# Patient Record
Sex: Male | Born: 1980 | Race: White | Hispanic: No | Marital: Single | Smoking: Former smoker
Health system: Southern US, Community
[De-identification: ages and names within clinical notes are randomized; demographics above are authoritative.]

## PROBLEM LIST (undated history)

## (undated) DIAGNOSIS — I10 Essential (primary) hypertension: Secondary | ICD-10-CM

## (undated) DIAGNOSIS — G4730 Sleep apnea, unspecified: Secondary | ICD-10-CM

## (undated) DIAGNOSIS — E669 Obesity, unspecified: Secondary | ICD-10-CM

## (undated) HISTORY — DX: Essential (primary) hypertension: I10

---

## 2003-10-02 HISTORY — PX: MOUTH SURGERY: SHX715

## 2017-09-04 ENCOUNTER — Ambulatory Visit
Admission: EM | Admit: 2017-09-04 | Discharge: 2017-09-04 | Disposition: A | Discharge status: Home / Self Care | Payer: Self-pay | Attending: Family Medicine | Admitting: Family Medicine

## 2017-09-04 ENCOUNTER — Other Ambulatory Visit: Payer: Self-pay

## 2017-09-04 ENCOUNTER — Encounter: Payer: Self-pay | Admitting: Emergency Medicine

## 2017-09-04 DIAGNOSIS — R3915 Urgency of urination: Secondary | ICD-10-CM

## 2017-09-04 HISTORY — DX: Obesity, unspecified: E66.9

## 2017-09-04 LAB — URINALYSIS, COMPLETE (UACMP) WITH MICROSCOPIC
Bacteria, UA: NONE SEEN
Bilirubin Urine: NEGATIVE
GLUCOSE, UA: NEGATIVE mg/dL
HGB URINE DIPSTICK: NEGATIVE
Ketones, ur: NEGATIVE mg/dL
Leukocytes, UA: NEGATIVE
NITRITE: NEGATIVE
PH: 6 (ref 5.0–8.0)
Protein, ur: NEGATIVE mg/dL
Specific Gravity, Urine: 1.025 (ref 1.005–1.030)

## 2017-09-04 LAB — CHLAMYDIA/NGC RT PCR (ARMC ONLY)
Chlamydia Tr: NOT DETECTED
N GONORRHOEAE: NOT DETECTED

## 2017-09-04 NOTE — ED Triage Notes (Signed)
Patient in today c/o 3 day history of urinary urgency and slight dysuria. Patient denies fever. Patient has not tried any OTC meds.

## 2017-09-04 NOTE — Discharge Instructions (Signed)
Over the counter pyridium Dwain Sarna(Urispas)

## 2017-09-30 DIAGNOSIS — R030 Elevated blood-pressure reading, without diagnosis of hypertension: Secondary | ICD-10-CM | POA: Insufficient documentation

## 2017-09-30 DIAGNOSIS — Z0000 Encounter for general adult medical examination without abnormal findings: Secondary | ICD-10-CM | POA: Insufficient documentation

## 2017-12-23 NOTE — Progress Notes (Signed)
12/24/2017 11:56 AM   Michael Cuevas [birth year] [id number]  Referring provider: Gracelyn NurseJohnston, John D, MD [address] [place], [place] [id number]  Chief Complaint  Patient presents with  . Establish Care    urinary urgency, possible phimosis     HPI: Patient is a [age] year old Caucasian male referred by Dr. Letitia LibraJohnston for urinary urgency and possible phimosis.  He states that he is having intermittent symptoms of frequency x5 and a strong urge to urinate.  He stated that this began at the end of November.  He was evaluated at an urgent care center who stated they had no idea while he was experiencing the symptoms and to acquire himself a PCP.  He then went to another urgent care and was diagnosed with prostatitis and placed on antibiotics which provided some relief.  A month ago his symptoms returned and at that time he had acquired a PCP.  He states his PCP gave him the same antibiotic which helped lessen his symptoms but did not completely abate.  He states that sitting down and at nighttime this urgency is worse.  He is not noted anything that helps the urgency.  Patient denies any gross hematuria, dysuria or suprapubic/flank pain.  Patient denies any fevers, chills, nausea or vomiting.  His UA is negative.   His PVR is 11 mL.    He states he is on sure how long he has had difficulty with his foreskin.    He consumes 2-3 alcoholic beverages per week.  He has 1 caffeinated beverage daily.  He is a former smoker.  He quit 15 years ago, but he smoked 1 pack a day for 10 years.   PMH: Past Medical History:  Diagnosis Date  . Hypertension   . Obesity     Surgical History: No past surgical history on file.  Home Medications:  Allergies as of 12/24/2017   No Known Allergies     Medication List        Accurate as of 12/24/17 11:59 PM. Always use your most recent med list.          lisinopril 10 MG tablet Commonly known as:  PRINIVIL,ZESTRIL Take by mouth.        Allergies: No Known Allergies  Family History: Family History  Problem Relation Age of Onset  . Lupus Mother   . Cancer Father [id number]       bladder    Social History:  reports that he quit smoking about 12 years ago. He has never used smokeless tobacco. He reports that he drinks alcohol. He reports that he does not use drugs.  ROS: UROLOGY Frequent Urination?: Yes Hard to postpone urination?: Yes Burning/pain with urination?: No Get up at night to urinate?: No Leakage of urine?: No Urine stream starts and stops?: No Trouble starting stream?: No Do you have to strain to urinate?: No Blood in urine?: No Urinary tract infection?: No Sexually transmitted disease?: No Injury to kidneys or bladder?: No Painful intercourse?: No Weak stream?: No Erection problems?: No Penile pain?: No  Gastrointestinal Nausea?: No Vomiting?: No Indigestion/heartburn?: No Diarrhea?: No Constipation?: No  Constitutional Fever: No Night sweats?: No Weight loss?: No Fatigue?: No  Skin Skin rash/lesions?: No Itching?: No  Eyes Blurred vision?: No Double vision?: No  Ears/Nose/Throat Sore throat?: No Sinus problems?: No  Hematologic/Lymphatic Swollen glands?: No Easy bruising?: No  Cardiovascular Leg swelling?: No Chest pain?: No  Respiratory Cough?: No Shortness of breath?: No  Endocrine Excessive thirst?: No  Musculoskeletal Back pain?: No Joint pain?: No  Neurological Headaches?: No Dizziness?: No  Psychologic Depression?: No Anxiety?: No  Physical Exam: BP (!) 141/92   Pulse 99   Wt (!) 358 lb 1.6 oz (162.4 kg)   BMI 54.45 kg/m   Constitutional: Well nourished. Alert and oriented, No acute distress. HEENT: [place] AT, moist mucus membranes. Trachea midline, no masses. Cardiovascular: No clubbing, cyanosis, or edema. Respiratory: Normal respiratory effort, no increased work of breathing. GI: Abdomen is soft, non tender, non distended, no abdominal  masses. Liver and spleen not palpable.  No hernias appreciated.  Stool sample for occult testing is not indicated.   GU: No CVA tenderness.  No bladder fullness or masses.  Patient with buried penis.  Phimosis with a 3 mm aperature.  Urethral meatus is patent.  No penile discharge. No penile lesions or rashes. Scrotum without lesions, cysts, rashes and/or edema.  Testicles are located scrotally bilaterally. No masses are appreciated in the testicles. Left and right epididymis are normal. Rectal: Patient with  normal sphincter tone. Anus and perineum without scarring or rashes. No rectal masses are appreciated. Prostate is approximately 35 grams, no nodules are appreciated. Seminal vesicles are normal. Skin: No rashes, bruises or suspicious lesions. Lymph: No cervical or inguinal adenopathy. Neurologic: Grossly intact, no focal deficits, moving all 4 extremities. Psychiatric: Normal mood and affect.  Laboratory Data: PSA  1.10 in 11/2017 No results found for: WBC, HGB, HCT, MCV, PLT  No results found for: CREATININE  No results found for: PSA  No results found for: TESTOSTERONE  No results found for: HGBA1C  No results found for: TSH  No results found for: CHOL, HDL, CHOLHDL, VLDL, LDLCALC  No results found for: AST No results found for: ALT No components found for: ALKALINEPHOPHATASE No components found for: BILIRUBINTOTAL  No results found for: ESTRADIOL  Urinalysis Negative.  See Epic.  I have reviewed the labs.   Pertinent Imaging: Results for REYNOLDS, KITTEL (MRN [id number]) as of 01/13/2018 11:53  Ref. Range 12/24/2017 13:29  Scan Result Unknown 11 ml    Assessment & Plan:    1. Urgency Patient's UA today is negative and antibiotics in the past have provided some relief We will send urine for culture today PVR is minimal, so he has no difficulty emptying his bladder We will need to obtain CT renal stone study to evaluate for a possible ureteral stone causing the  symptoms Advised to contact our office or seek treatment in the ED if becomes febrile or pain/ vomiting are difficult control in order to arrange for emergent/urgent intervention  2. Phimosis Patient will need to undergo circumcision in the OR due to his body habitus Patient will contact his insurance company and the hospital to see if this is a viable option for him  Return for pending CT results.  These notes generated with voice recognition software. I apologize for typographical errors.  Michiel Cowboy, PA-C  [facility] [address] [place], [place] [address] [phone]

## 2017-12-24 ENCOUNTER — Other Ambulatory Visit: Payer: Self-pay

## 2017-12-24 ENCOUNTER — Ambulatory Visit (INDEPENDENT_AMBULATORY_CARE_PROVIDER_SITE_OTHER): Payer: Self-pay | Admitting: Urology

## 2017-12-24 ENCOUNTER — Encounter: Payer: Self-pay | Admitting: Urology

## 2017-12-24 VITALS — BP 141/92 | HR 99 | Wt 358.1 lb

## 2017-12-24 DIAGNOSIS — R3915 Urgency of urination: Secondary | ICD-10-CM

## 2017-12-24 DIAGNOSIS — R1030 Lower abdominal pain, unspecified: Secondary | ICD-10-CM

## 2017-12-24 DIAGNOSIS — N471 Phimosis: Secondary | ICD-10-CM

## 2017-12-24 LAB — URINALYSIS, COMPLETE
Bilirubin, UA: NEGATIVE
GLUCOSE, UA: NEGATIVE
KETONES UA: NEGATIVE
Leukocytes, UA: NEGATIVE
NITRITE UA: NEGATIVE
PROTEIN UA: NEGATIVE
RBC, UA: NEGATIVE
SPEC GRAV UA: 1.025 (ref 1.005–1.030)
UUROB: 0.2 mg/dL (ref 0.2–1.0)
pH, UA: 6 (ref 5.0–7.5)

## 2017-12-24 LAB — MICROSCOPIC EXAMINATION
Bacteria, UA: NONE SEEN
Epithelial Cells (non renal): NONE SEEN /hpf (ref 0–10)
RBC MICROSCOPIC, UA: NONE SEEN /HPF (ref 0–2)
WBC UA: NONE SEEN /HPF (ref 0–5)

## 2017-12-24 LAB — BLADDER SCAN AMB NON-IMAGING

## 2017-12-28 LAB — CULTURE, URINE COMPREHENSIVE

## 2017-12-30 ENCOUNTER — Telehealth: Payer: Self-pay

## 2017-12-30 NOTE — Telephone Encounter (Signed)
-----   Message from Harle BattiestShannon A McGowan, PA-C sent at 12/29/2017  8:52 PM EDT ----- Please let the patient know that his urine culture was negative.

## 2017-12-30 NOTE — Telephone Encounter (Signed)
Patient notified

## 2018-01-01 ENCOUNTER — Ambulatory Visit
Admission: RE | Admit: 2018-01-01 | Discharge: 2018-01-01 | Disposition: A | Discharge status: Home / Self Care | Payer: Self-pay | Source: Ambulatory Visit | Attending: Urology | Admitting: Urology

## 2018-01-01 DIAGNOSIS — R1030 Lower abdominal pain, unspecified: Secondary | ICD-10-CM

## 2018-01-02 ENCOUNTER — Telehealth: Payer: Self-pay

## 2018-01-02 NOTE — Telephone Encounter (Signed)
-----   Message from Harle BattiestShannon A McGowan, PA-C sent at 01/02/2018  1:18 PM EDT ----- I have spoken with Mr. Dahlia ByesYounger regarding his CT results.  I have left Myrbetriq 25 mg, # 28 samples up front for him to pick up for his urgency.

## 2018-01-03 ENCOUNTER — Encounter: Payer: Self-pay | Admitting: Urology

## 2018-01-03 ENCOUNTER — Telehealth: Payer: Self-pay

## 2018-01-03 NOTE — Telephone Encounter (Signed)
Pt sent a mychart message asking for another medication other than myrbetriq. Pt stated that he took myrbetriq last night and his BP today was high. Please advise.

## 2018-01-03 NOTE — Telephone Encounter (Signed)
He can have samples of Toviaz 4 mg, one daily, #28.

## 2018-01-07 NOTE — Telephone Encounter (Signed)
Patient notified and medication is up front

## 2018-01-13 ENCOUNTER — Telehealth: Payer: Self-pay | Admitting: Urology

## 2018-01-13 NOTE — Telephone Encounter (Signed)
Patient will need a follow up after he has been on the Toviaz for 3 weeks for a PVR and I PSS.

## 2018-01-13 NOTE — Telephone Encounter (Signed)
Can you please schedule this patient?

## 2018-01-14 NOTE — Telephone Encounter (Signed)
App made and patient notified ° °Michael Cuevas °

## 2018-01-20 ENCOUNTER — Encounter: Payer: Self-pay | Admitting: Urology

## 2018-01-20 ENCOUNTER — Telehealth: Payer: Self-pay | Admitting: Urology

## 2018-01-20 NOTE — Telephone Encounter (Signed)
We need to add Losartan  25 mg and take out the lisinopril on his med rec.

## 2018-01-21 ENCOUNTER — Other Ambulatory Visit: Payer: Self-pay

## 2018-01-21 NOTE — Telephone Encounter (Signed)
Done

## 2018-02-10 ENCOUNTER — Ambulatory Visit: Payer: Self-pay | Admitting: Urology

## 2018-03-06 ENCOUNTER — Encounter: Payer: Self-pay | Admitting: Urology

## 2018-03-27 DIAGNOSIS — I10 Essential (primary) hypertension: Secondary | ICD-10-CM | POA: Insufficient documentation

## 2019-04-13 ENCOUNTER — Telehealth: Payer: Self-pay | Admitting: Urology

## 2019-04-13 NOTE — Telephone Encounter (Signed)
Pt. Would like to speak to [facility] about surgery. Would like to know if he can just talk over the phone.

## 2019-04-30 ENCOUNTER — Telehealth: Payer: Self-pay | Admitting: Urology

## 2019-04-30 NOTE — Telephone Encounter (Signed)
Pt called and wouldl ike a call back to discuss circumcision. He states that he has spoke to [place] in the past. Please advise.

## 2019-05-01 NOTE — Telephone Encounter (Signed)
App made pt is aware ° ° °Michael Cuevas °

## 2019-05-01 NOTE — Telephone Encounter (Signed)
Patient will need an appointment to discuss further

## 2019-05-18 NOTE — Progress Notes (Signed)
05/19/2019 1:34 PM   Michael Cuevas 1981-06-01 [id number]  Referring provider: Gracelyn NurseJohnston, John D, MD [address] [place],  [place] [id number]  Chief Complaint  Patient presents with  . Circ consult    HPI: Patient is a 38 year old male who presents today to discuss a possible circumcision.    He is no longer able to draw his foreskin past his glans.  This is bothersome to him as it interferes with urination and intercourse.  Patient denies any gross hematuria, dysuria or suprapubic/flank pain.  Patient denies any fevers, chills, nausea or vomiting.   PMH: Past Medical History:  Diagnosis Date  . Hypertension   . Obesity     Surgical History: History reviewed. No pertinent surgical history.  Home Medications:  Allergies as of 05/19/2019   No Known Allergies     Medication List       Accurate as of May 19, 2019 11:59 PM. If you have any questions, ask your nurse or doctor.        hydrochlorothiazide 25 MG tablet Commonly known as: HYDRODIURIL   losartan 25 MG tablet Commonly known as: COZAAR       Allergies: No Known Allergies  Family History: Family History  Problem Relation Age of Onset  . Lupus Mother   . Cancer Father [id number]       bladder    Social History:  reports that he quit smoking about 13 years ago. He has never used smokeless tobacco. He reports current alcohol use. He reports that he does not use drugs.  ROS: UROLOGY Frequent Urination?: No Hard to postpone urination?: No Burning/pain with urination?: No Get up at night to urinate?: No Leakage of urine?: No Urine stream starts and stops?: No Trouble starting stream?: No Do you have to strain to urinate?: No Blood in urine?: No Urinary tract infection?: No Sexually transmitted disease?: No Injury to kidneys or bladder?: No Painful intercourse?: No Weak stream?: No Erection problems?: No Penile pain?: No  Gastrointestinal Nausea?: No Vomiting?: No  Indigestion/heartburn?: No Diarrhea?: No Constipation?: No  Constitutional Fever: No Night sweats?: No Weight loss?: No Fatigue?: No  Skin Skin rash/lesions?: No Itching?: No  Eyes Blurred vision?: No Double vision?: No  Ears/Nose/Throat Sore throat?: No Sinus problems?: No  Hematologic/Lymphatic Swollen glands?: No Easy bruising?: No  Cardiovascular Leg swelling?: No Chest pain?: No  Respiratory Cough?: No Shortness of breath?: No  Endocrine Excessive thirst?: No  Musculoskeletal Back pain?: No Joint pain?: No  Neurological Headaches?: No Dizziness?: No  Psychologic Depression?: No Anxiety?: No  Physical Exam: BP 114/74 (BP Location: Left Arm, Patient Position: Sitting, Cuff Size: Normal)   Pulse (!) 118   Ht 5\' 8"  (1.727 m)   Wt (!) 365 lb 1.6 oz (165.6 kg)   BMI 55.51 kg/m   Constitutional:  Well nourished. Alert and oriented, No acute distress. HEENT: [place] AT, moist mucus membranes.  Trachea midline, no masses. Cardiovascular: No clubbing, cyanosis, or edema. Respiratory: Normal respiratory effort, no increased work of breathing. GI: Abdomen is obese, soft, non tender, non distended, no abdominal masses. Liver and spleen not palpable.  No hernias appreciated.  Stool sample for occult testing is not indicated.   GU: No CVA tenderness.  No bladder fullness or masses.  Patient with buried, uncircumcised phallus.   Foreskin could not be retracted.  Could not examine the glands.  Scrotum without lesions, cysts, rashes and/or edema.  Testicles are located scrotally bilaterally. No masses are appreciated in  the testicles. Left and right epididymis are normal. Rectal: Not performed.  Skin: No rashes, bruises or suspicious lesions. Lymph: No inguinal adenopathy. Neurologic: Grossly intact, no focal deficits, moving all 4 extremities. Psychiatric: Normal mood and affect.  Laboratory Data: PSA  1.10 in 11/2017 No results found for: WBC, HGB, HCT, MCV, PLT   No results found for: CREATININE  No results found for: PSA  No results found for: TESTOSTERONE  No results found for: HGBA1C  No results found for: TSH  No results found for: CHOL, HDL, CHOLHDL, VLDL, LDLCALC  No results found for: AST No results found for: ALT No components found for: ALKALINEPHOPHATASE No components found for: BILIRUBINTOTAL  No results found for: ESTRADIOL   I have reviewed the labs.   Assessment & Plan:    1. Phimosis Patient is desiring a circumcision at this time as he is no longer able to draw back the foreskin.   I explained that the circumcision will need to be performed in the OR due to his body habitus.  The procedure is explained and the risk involved, such as infection, bleeding, removal of too much or too little of the foreskin affecting the appearance of the penis, irritation and narrowing of the urinary opening and scarring of the penis.  After the procedure he will have penile swelling, penile pain and bleeding which will resolve over time.  He will have absorbable sutures placed, so he will not be able to submerge himself in water for several days.  The glans will be sensitive for a time after the foreskin is removed.  Since the glans cannot be examined today, if areas of concern for penile cancers as seen they may be biopsies during the circumcision.   I also explained the risks of general anesthesia, such as: MI, CVA, paralysis, coma and/or death. He understands these risks and wishes to proceed.  He states he is not taking NSAIDS or anticoagulants.  Schedule circumcision in the OR.    Return for Circumcision in the OR .  These notes generated with voice recognition software. I apologize for typographical errors.  Zara Council, PA-C  [facility] [place], [place] [address] [phone]  I spent 25 with this patient in a face to face conversation of which greater than 50% was spent in  counseling and coordination of care with the patient in regards to the circumcision procedure and post op care and risks.

## 2019-05-19 ENCOUNTER — Ambulatory Visit: Payer: PRIVATE HEALTH INSURANCE | Admitting: Urology

## 2019-05-19 ENCOUNTER — Other Ambulatory Visit: Payer: Self-pay

## 2019-05-19 ENCOUNTER — Encounter: Payer: Self-pay | Admitting: Urology

## 2019-05-19 VITALS — BP 114/74 | HR 118 | Ht 68.0 in | Wt 365.1 lb

## 2019-05-19 DIAGNOSIS — N471 Phimosis: Secondary | ICD-10-CM

## 2019-05-25 NOTE — Patient Instructions (Signed)
Circumcision, Adult  Circumcision is a surgery to remove the foreskin of the penis or to cut the foreskin so the space between skin and the tip of the penis is larger. When only the foreskin is cut, it is called a dorsal incision. A dorsal circumcision leaves the entire foreskin but makes the end of the foreskin looser so it can be pulled back over the head of the penis. Tell a health care provider about:  Any allergies you have.  All medicines you are taking, including vitamins, herbs, eye drops, creams, and over-the-counter medicines.  Any problems you or family members have had with anesthetic medicines.  Any blood disorders you have.  Any surgeries you have had.  Any medical conditions you have, including a cold or other infection. What are the risks? Generally, this is a safe procedure. However, problems may occur, including:  Bleeding.  Infection.  Pain.  Urethral injury. The urethra is a tube that ends at the tip of the penis and carries urine out of your body.  Opening of the surgical wound. This can occur from an unwanted erection after surgery.  Allergic reactions to medicines. What happens before the procedure? Staying hydrated Follow instructions from your health care provider about hydration, which may include:  Up to 2 hours before the procedure - you may continue to drink clear liquids, such as water, clear fruit juice, black coffee, and plain tea. Eating and drinking restrictions Follow instructions from your health care provider about eating and drinking, which may include:  8 hours before the procedure - stop eating heavy meals or foods such as meat, fried foods, or fatty foods.  6 hours before the procedure - stop eating light meals or foods, such as toast or cereal.  6 hours before the procedure - stop drinking milk or drinks that contain milk.  2 hours before the procedure - stop drinking clear liquids. Medicines  Ask your health care provider  about: ? Changing or stopping your regular medicines. This is especially important if you take diabetes medicines or blood thinners. ? Taking medicines such as aspirin and ibuprofen. These medicines can thin your blood. Do not take these medicines before your procedure if your doctor instructs you not to.  You may be given antibiotic medicine to help prevent infection. General instructions  If you will be going home right after the procedure, plan to have someone with you for 24 hours.  Plan to have someone take you home from the hospital or clinic.  Ask your health care provider how your surgical site will be marked or identified.  You may be asked to shower with a germ-killing soap. What happens during the procedure?  To reduce your risk of infection: ? Your health care team will wash or sanitize their hands. ? Your skin will be washed with soap.  An IV tube may be inserted into one of your veins.  You may be given medicine to help you relax (sedative).  An anesthetic will be injected with a needle into the skin of your penis (local anesthetic) to numb the nerves of your foreskin.  An incision will be made to remove or cut the foreskin.  Absorbable stitches (sutures) may be used to close the incision.  A bandage (dressing) will be applied to the incision site. The procedure may vary among health care providers and hospitals. What happens after the procedure?  Your blood pressure, heart rate, breathing rate, and blood oxygen level will be monitored until the medicines   you were given have worn off.  Do not get out of bed until your health care provider approves.  Do not drive for 24 hours after the procedure if you were given a sedative. Summary  Circumcision is a surgery to remove the foreskin of the penis or to cut the foreskin so the opening is larger.  Absorbable sutures may be used to close the incision after the foreskin has been removed or cut.  If you will be  going home right after the procedure, plan to have someone with you for 24 hours. This information is not intended to replace advice given to you by your health care provider. Make sure you discuss any questions you have with your health care provider. Document Released: 10/07/2007 Document Revised: 08/30/2017 Document Reviewed: 08/06/2016 Elsevier Patient Education  2020 [employer] Inc[employer]  

## 2019-05-28 ENCOUNTER — Encounter
Admission: RE | Admit: 2019-05-28 | Discharge: 2019-05-28 | Disposition: A | Discharge status: Home / Self Care | Payer: PRIVATE HEALTH INSURANCE | Source: Ambulatory Visit | Attending: Urology | Admitting: Urology

## 2019-05-28 ENCOUNTER — Other Ambulatory Visit: Payer: Self-pay

## 2019-05-28 HISTORY — DX: Sleep apnea, unspecified: G47.30

## 2019-05-28 NOTE — Patient Instructions (Signed)
Your procedure is scheduled on: 06/03/2019 Wed Report to Same Day Surgery 2nd floor medical mall [facility] Entrance-take elevator on left to 2nd floor.  Check in with surgery information desk.) To find out your arrival time please call [phone] between 1PM - 3PM on 06/02/2019 Tues  Remember: Instructions that are not followed completely may result in serious medical risk, up to and including death, or upon the discretion of your surgeon and anesthesiologist your surgery may need to be rescheduled.    _x___ 1. Do not eat food after midnight the night before your procedure. You may drink clear liquids up to 2 hours before you are scheduled to arrive at the hospital for your procedure.  Do not drink clear liquids within 2 hours of your scheduled arrival to the hospital.  Clear liquids include  --Water or Apple juice without pulp  --Clear carbohydrate beverage such as ClearFast or Gatorade  --Black Coffee or Clear Tea (No milk, no creamers, do not add anything to                  the coffee or Tea Type 1 and type 2 diabetics should only drink water.   ____Ensure clear carbohydrate drink on the way to the hospital for bariatric patients  ____Ensure clear carbohydrate drink 3 hours before surgery.   No gum chewing or hard candies.     __x__ 2. No Alcohol for 24 hours before or after surgery.   __x__3. No Smoking or e-cigarettes for 24 prior to surgery.  Do not use any chewable tobacco products for at least 6 hour prior to surgery   ____  4. Bring all medications with you on the day of surgery if instructed.    __x__ 5. Notify your doctor if there is any change in your medical condition     (cold, fever, infections).    x___6. On the morning of surgery brush your teeth with toothpaste and water.  You may rinse your mouth with mouth wash if you wish.  Do not swallow any toothpaste or mouthwash.   Do not wear jewelry, make-up, hairpins, clips or nail polish.  Do not wear lotions,  powders, or perfumes. You may wear deodorant.  Do not shave 48 hours prior to surgery. Men may shave face and neck.  Do not bring valuables to the hospital.    [facility] is not responsible for any belongings or valuables.               Contacts, dentures or bridgework may not be worn into surgery.  Leave your suitcase in the car. After surgery it may be brought to your room.  For patients admitted to the hospital, discharge time is determined by your                       treatment team.  _  Patients discharged the day of surgery will not be allowed to drive home.  You will need someone to drive you home and stay with you the night of your procedure.    Please read over the following fact sheets that you were given:   [facility] Preparing for Surgery and or MRSA Information   _x___ Take anti-hypertensive listed below, cardiac, seizure, asthma,     anti-reflux and psychiatric medicines. These include:  1. None  2.  3.  4.  5.  6.  ____Fleets enema or Magnesium Citrate as directed.   _x___ Use CHG Soap or  sage wipes as directed on instruction sheet   ____ Use inhalers on the day of surgery and bring to hospital day of surgery  ____ Stop Metformin and Janumet 2 days prior to surgery.    ____ Take 1/2 of usual insulin dose the night before surgery and none on the morning     surgery.   _x___ Follow recommendations from Cardiologist, Pulmonologist or PCP regarding          stopping Aspirin, Coumadin, Plavix ,Eliquis, Effient, or Pradaxa, and Pletal.  X____Stop Anti-inflammatories such as Advil, Aleve, Ibuprofen, Motrin, Naproxen, Naprosyn, Goodies powders or aspirin products. OK to take Tylenol and                          Celebrex.   _x___ Stop supplements until after surgery.  But may continue Vitamin D, Vitamin B,       and multivitamin.  Stop turmeric today.   ____ Bring C-Pap to the hospital.

## 2019-05-29 ENCOUNTER — Other Ambulatory Visit
Admission: RE | Admit: 2019-05-29 | Discharge: 2019-05-29 | Disposition: A | Discharge status: Home / Self Care | Payer: PRIVATE HEALTH INSURANCE | Source: Ambulatory Visit | Attending: Urology | Admitting: Urology

## 2019-05-29 ENCOUNTER — Other Ambulatory Visit: Payer: Self-pay

## 2019-05-29 ENCOUNTER — Other Ambulatory Visit: Payer: PRIVATE HEALTH INSURANCE

## 2019-05-29 DIAGNOSIS — Z01818 Encounter for other preprocedural examination: Secondary | ICD-10-CM | POA: Diagnosis present

## 2019-05-29 DIAGNOSIS — I10 Essential (primary) hypertension: Secondary | ICD-10-CM

## 2019-05-29 DIAGNOSIS — Z20828 Contact with and (suspected) exposure to other viral communicable diseases: Secondary | ICD-10-CM | POA: Diagnosis not present

## 2019-05-29 LAB — PROTIME-INR
INR: 1 (ref 0.8–1.2)
Prothrombin Time: 13.4 seconds (ref 11.4–15.2)

## 2019-05-29 LAB — BASIC METABOLIC PANEL
Anion gap: 9 (ref 5–15)
BUN: 13 mg/dL (ref 6–20)
CO2: 26 mmol/L (ref 22–32)
Calcium: 9 mg/dL (ref 8.9–10.3)
Chloride: 104 mmol/L (ref 98–111)
Creatinine, Ser: 0.97 mg/dL (ref 0.61–1.24)
GFR calc Af Amer: >60 mL/min (ref >60)
GFR calc non Af Amer: >60 mL/min (ref >60)
Glucose, Bld: 113 mg/dL — ABNORMAL HIGH (ref 70–99)
Potassium: 3.6 mmol/L (ref 3.5–5.1)
Sodium: 139 mmol/L (ref 135–145)

## 2019-05-29 LAB — CBC
HCT: 44.2 % (ref 39.0–52.0)
Hemoglobin: 14.4 g/dL (ref 13.0–17.0)
MCH: 28.9 pg (ref 26.0–34.0)
MCHC: 32.6 g/dL (ref 30.0–36.0)
MCV: 88.6 fL (ref 80.0–100.0)
Platelets: 230 10*3/uL (ref 150–400)
RBC: 4.99 MIL/uL (ref 4.22–5.81)
RDW: 14.2 % (ref 11.5–15.5)
WBC: 8 10*3/uL (ref 4.0–10.5)
nRBC: 0 % (ref 0.0–0.2)

## 2019-05-29 LAB — APTT: aPTT: 31 seconds (ref 24–36)

## 2019-05-29 LAB — SARS CORONAVIRUS 2 (TAT 6-24 HRS): SARS Coronavirus 2: NEGATIVE

## 2019-06-02 MED ORDER — CEFAZOLIN SODIUM-DEXTROSE 2-4 GM/100ML-% IV SOLN: 2.0000 g | Freq: Once | INTRAVENOUS | Status: DC

## 2019-06-03 ENCOUNTER — Encounter: Admission: RE | Payer: Self-pay | Source: Home / Self Care

## 2019-06-03 ENCOUNTER — Ambulatory Visit: Admission: RE | Admit: 2019-06-03 | Payer: PRIVATE HEALTH INSURANCE | Source: Home / Self Care | Admitting: Urology

## 2019-06-03 SURGERY — CIRCUMCISION, ADULT
Anesthesia: Choice

## 2019-06-12 ENCOUNTER — Encounter: Payer: Self-pay | Admitting: Urology

## 2019-06-12 ENCOUNTER — Ambulatory Visit (INDEPENDENT_AMBULATORY_CARE_PROVIDER_SITE_OTHER): Payer: PRIVATE HEALTH INSURANCE | Admitting: Urology

## 2019-06-12 ENCOUNTER — Other Ambulatory Visit: Payer: Self-pay

## 2019-06-12 VITALS — BP 115/66 | HR 103 | Ht 69.0 in | Wt 363.0 lb

## 2019-06-12 DIAGNOSIS — N471 Phimosis: Secondary | ICD-10-CM

## 2019-06-12 DIAGNOSIS — N4883 Acquired buried penis: Secondary | ICD-10-CM

## 2019-06-12 NOTE — Progress Notes (Signed)
06/12/2019 10:53 AM   Lorraine J Lafavor 01-Oct-1981 [id number]  Referring provider: Gracelyn NurseJohnston, John D, MD [address] [place],  [place] [id number]  Chief Complaint  Patient presents with  . Pre-op Exam    HPI: 38 y.o. male seen by our PA for phimosis and was scheduled last week for circumcision.  I had not seen him preoperatively.  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  He elected to go ahead and cancel the procedure and come into the office for an exam.  He is morbidly obese.   PMH: Past Medical History:  Diagnosis Date  . Hypertension   . Obesity   . Sleep apnea     Surgical History: Past Surgical History:  Procedure Laterality Date  . MOUTH SURGERY      Home Medications:  Allergies as of 06/12/2019   No Known Allergies     Medication List       Accurate as of June 12, 2019 10:53 AM. If you have any questions, ask your nurse or doctor.        hydrochlorothiazide 25 MG tablet Commonly known as: HYDRODIURIL Take 12.5 mg by mouth daily.   losartan 50 MG tablet Commonly known as: COZAAR Take 50 mg by mouth daily.   Turmeric 500 MG Caps Take 500 mg by mouth daily.       Allergies: No Known Allergies  Family History: Family History  Problem Relation Age of Onset  . Lupus Mother   . Cancer Father [id number]       bladder    Social History:  reports that he quit smoking about 13 years ago. He has never used smokeless tobacco. He reports previous alcohol use. He reports that he does not use drugs.  ROS: UROLOGY Frequent Urination?: No Hard to postpone urination?: No Burning/pain with urination?: No Get up at night to urinate?: No Leakage of urine?: No Urine stream starts and stops?: No Trouble starting stream?: No Do you have to strain to urinate?: No Blood in urine?: No Urinary tract infection?:  No Sexually transmitted disease?: No Injury to kidneys or bladder?: No Painful intercourse?: No Weak stream?: No Erection problems?: No Penile pain?: No  Gastrointestinal Nausea?: No Vomiting?: No Indigestion/heartburn?: No Diarrhea?: No Constipation?: No  Constitutional Fever: No Night sweats?: No Weight loss?: No Fatigue?: No  Skin Skin rash/lesions?: No Itching?: No  Eyes Blurred vision?: No Double vision?: No  Ears/Nose/Throat Sore throat?: No Sinus problems?: No  Hematologic/Lymphatic Swollen glands?: No Easy bruising?: No  Cardiovascular Leg swelling?: No Chest pain?: No  Respiratory Cough?: No Shortness of breath?: No  Endocrine Excessive thirst?: No  Musculoskeletal Back pain?: No Joint pain?: No  Neurological Headaches?: No Dizziness?: No  Psychologic Depression?: No Anxiety?: No  Physical Exam: BP 115/66 (BP Location: Left Arm, Patient Position: Sitting, Cuff Size: Normal)   Pulse (!) 103   Ht 5\' 9"  (1.753 m)   Wt (!) 363 lb (164.7 kg)   BMI 53.61 kg/m   Constitutional:  Alert and oriented, No acute distress. GU: In the supine position the penis was buried and not palpable in the distal prepuce.  The penis was able to be exposed and once exposed the foreskin is phimotic with inability to retract.   Assessment & Plan:   38 y.o. male, morbidly obese  with a buried penis and phimosis.  We discussed that circumcision would resolve the phimosis but the penis would still be buried.  I recommended an opinion by Dr. Francesca Jewett at [facility].   Abbie Sons, [place] [address] [place], [place] [address] [phone]

## 2019-07-08 ENCOUNTER — Encounter: Payer: Self-pay | Admitting: Urology

## 2019-11-16 IMAGING — CT CT RENAL STONE PROTOCOL
2 of 4 series · 13 of 46 positions shown, 15 images · non-contrast
Comparison: None.

CLINICAL DATA: Lower abdominal and flank pain. Chronic urinary
frequency.

EXAM:
CT ABDOMEN AND PELVIS WITHOUT CONTRAST
TECHNIQUE: Multidetector CT imaging of the abdomen and pelvis was performed
following the standard protocol without IV contrast.

[Series 3: renal stone 5.00 br40 s3 ax · axial · 0.83mm/px · z∈[+1293,+1833]mm · 10 of 128 slices shown, 12 images]
[im 10/128  soft-tissue]
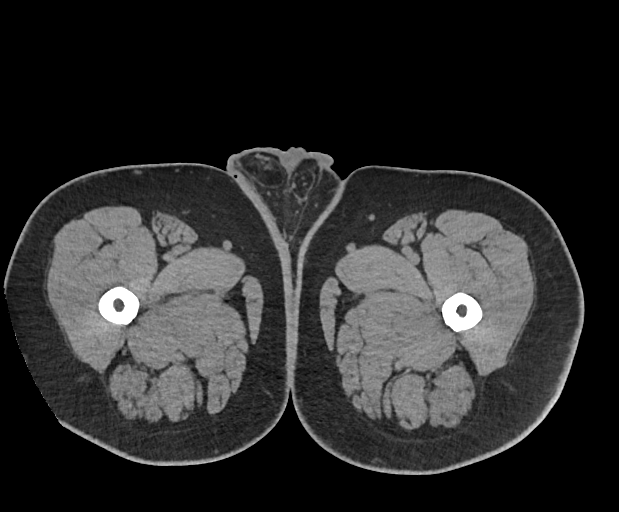
[im 10/128  bone]
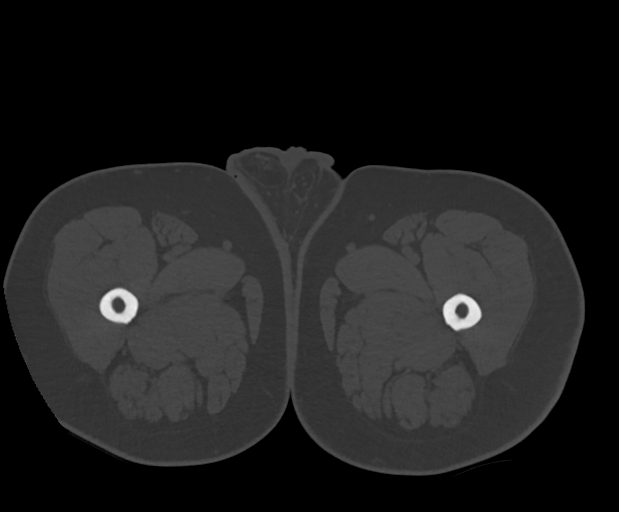
[im 20/128  soft-tissue]
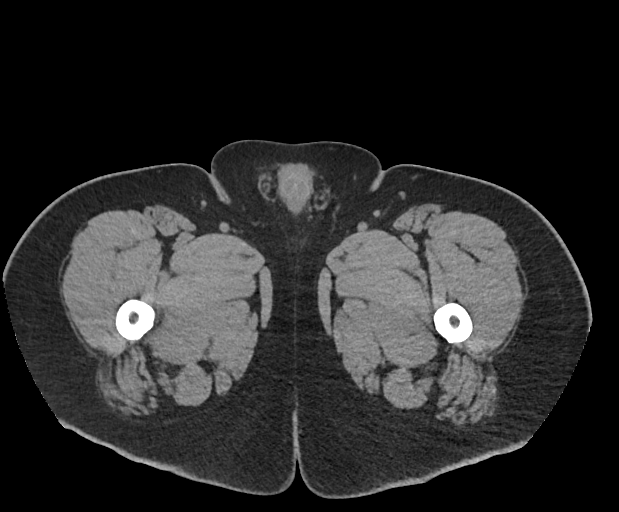
[im 35/128  soft-tissue]
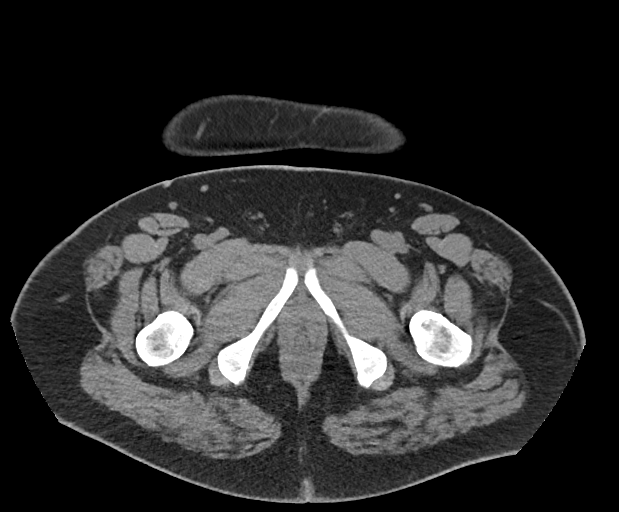
[im 44/128  soft-tissue]
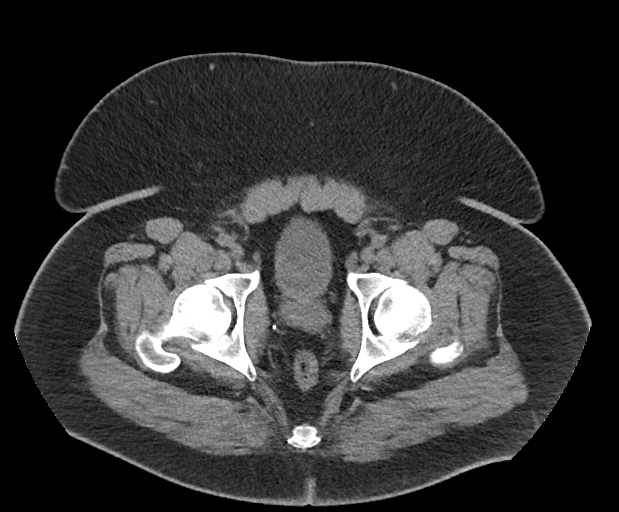
[im 59/128  soft-tissue]
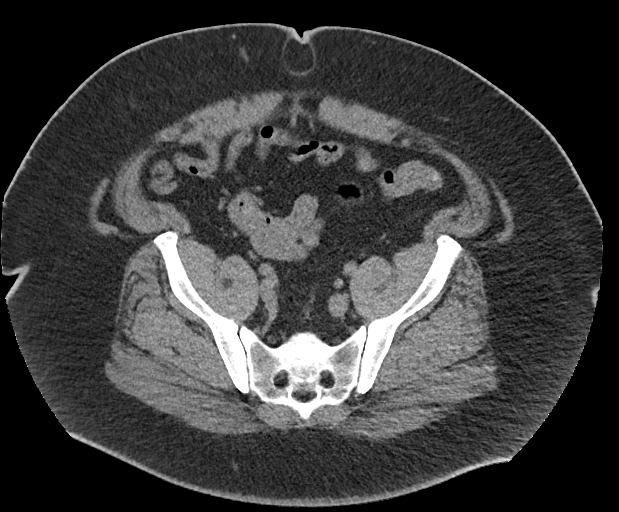
[im 69/128  soft-tissue]
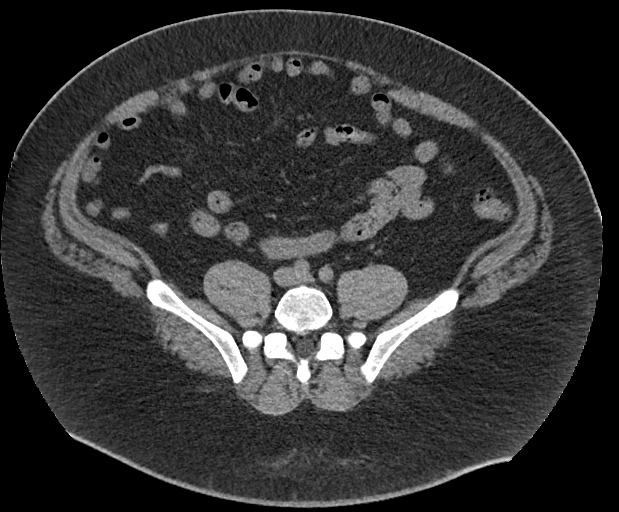
[im 84/128  soft-tissue]
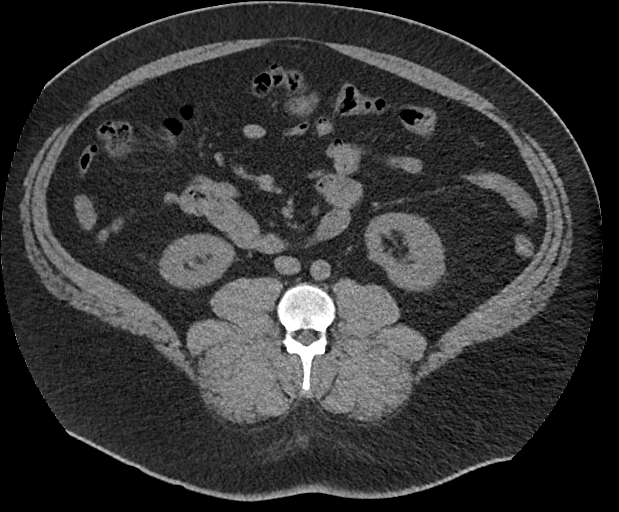
[im 93/128  soft-tissue]
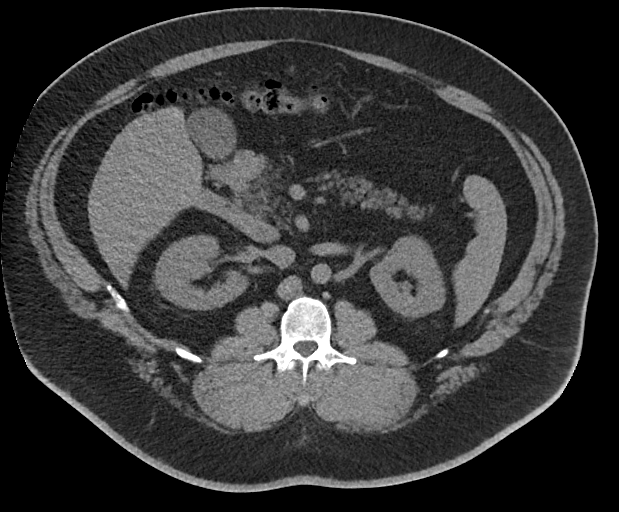
[im 108/128  soft-tissue]
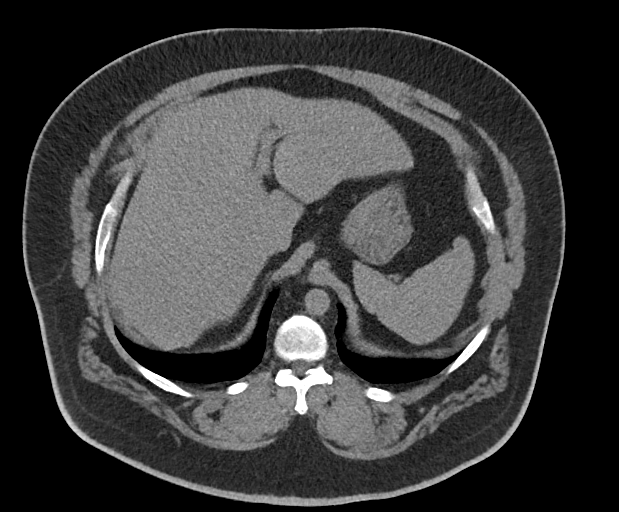
[im 108/128  bone]
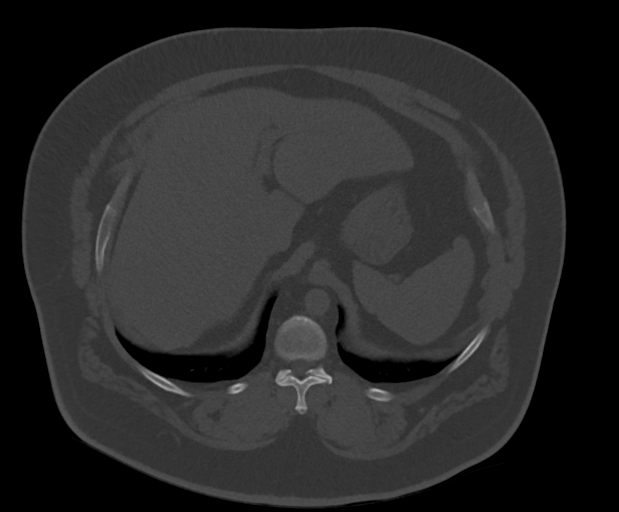
[im 118/128  soft-tissue]
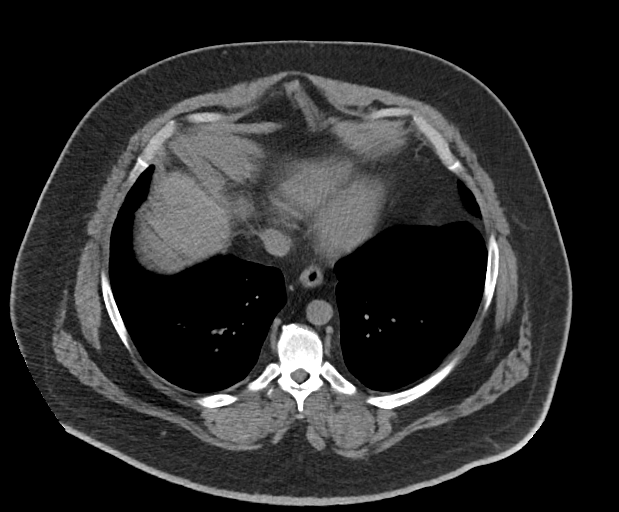

[Series 7: renal stone 2.00 br40 s3 cor · coronal · 1.00mm/px · 3 of 212 slices shown]
[im 71/212  soft-tissue]
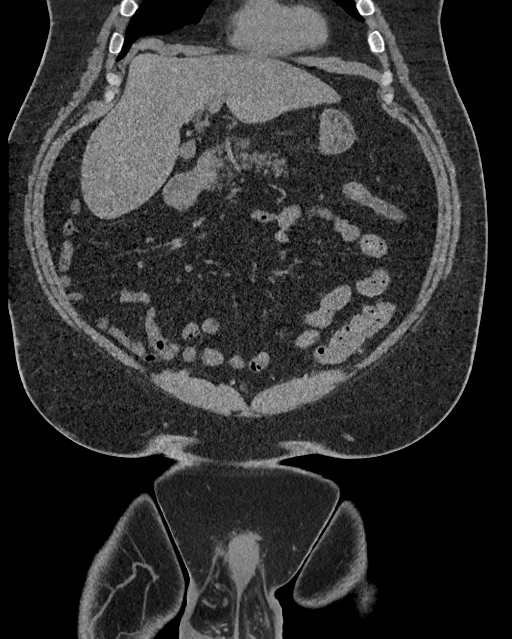
[im 94/212  soft-tissue]
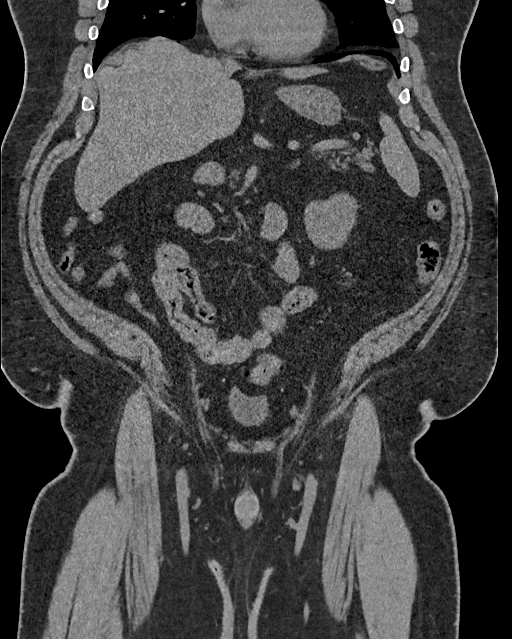
[im 118/212  soft-tissue]
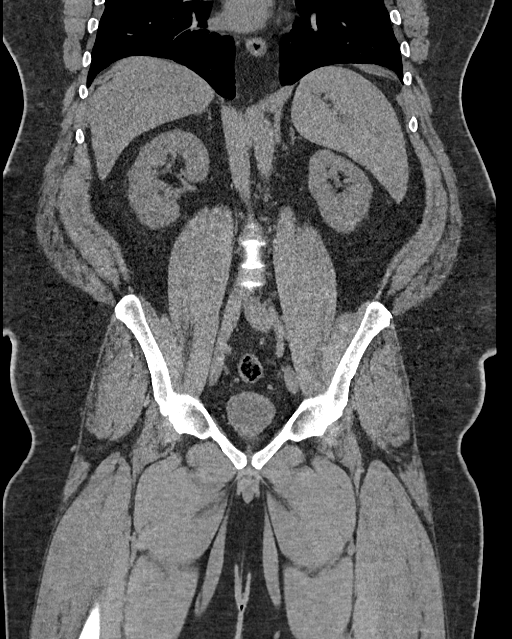

[13 of 46 positions shown; findings below may reference images not displayed]

FINDINGS: Lower chest: No significant pulmonary nodules or acute consolidative
airspace disease.

Hepatobiliary: Normal liver size. No liver mass. Normal gallbladder
with no radiopaque cholelithiasis. No biliary ductal dilatation.

Pancreas: Heterogeneous fatty infiltration of the pancreas. No
pancreatic mass or duct dilation.

Spleen: Mildly enlarged spleen (craniocaudal splenic length
cm). No splenic mass.

Adrenals/Urinary Tract: Normal adrenals. No renal stones. No
hydronephrosis. No contour deforming renal mass. Normal bladder.

Stomach/Bowel: Normal non-distended stomach. Normal caliber small
bowel with no small bowel wall thickening. Normal appendix. Mild
sigmoid diverticulosis, with no large bowel wall thickening or
pericolonic fat stranding.

Vascular/Lymphatic: Normal caliber abdominal aorta. No
pathologically enlarged lymph nodes in the abdomen or pelvis.

Reproductive: Mildly enlarged prostate. Prostate dimensions 4.4 x
4.0 x 4.2 cm (volume = 39 cm^3).

Other: No pneumoperitoneum, ascites or focal fluid collection. Small
fat containing umbilical hernia.

Musculoskeletal: No aggressive appearing focal osseous lesions. Mild
thoracolumbar spondylosis.
IMPRESSION: 1. No acute abnormality. No urolithiasis. No hydronephrosis. No
evidence of bowel obstruction or acute bowel inflammation. Normal
appendix.
2. Mildly enlarged prostate.
3. Mildly enlarged spleen.
4. Mild sigmoid diverticulosis.
5. Small fat containing umbilical hernia.

## 2019-12-17 ENCOUNTER — Ambulatory Visit: Payer: PRIVATE HEALTH INSURANCE | Attending: Internal Medicine

## 2019-12-17 DIAGNOSIS — Z23 Encounter for immunization: Secondary | ICD-10-CM

## 2019-12-17 NOTE — Progress Notes (Signed)
   Covid-19 Vaccination Clinic  Name:  [name]    MRN: [id number] DOB: September 09, 1981  12/17/2019  Mr. Lindstrom was observed post Covid-19 immunization for 15 minutes without incident. He was provided with Vaccine Information Sheet and instruction to access the V-Safe system.   Mr. Bertz was instructed to call 911 with any severe reactions post vaccine: Marland Kitchen Difficulty breathing  . Swelling of face and throat  . A fast heartbeat  . A bad rash all over body  . Dizziness and weakness   Immunizations Administered    Name Date Dose VIS Date Route   Pfizer COVID-19 Vaccine 12/17/2019  2:18 PM 0.3 mL 09/11/2019 Intramuscular   Manufacturer: [employer], [employer]   Lot: [id number]   NDC: [address]-1

## 2020-01-11 ENCOUNTER — Ambulatory Visit: Payer: PRIVATE HEALTH INSURANCE | Attending: Internal Medicine

## 2020-01-11 DIAGNOSIS — Z23 Encounter for immunization: Secondary | ICD-10-CM

## 2020-01-11 NOTE — Progress Notes (Signed)
   Covid-19 Vaccination Clinic  Name:  DESMOND SZABO    MRN: [id number] DOB: 11-24-1980  01/11/2020  Mr. Geisinger was observed post Covid-19 immunization for 15 minutes without incident. He was provided with Vaccine Information Sheet and instruction to access the V-Safe system.   Mr. Besecker was instructed to call 911 with any severe reactions post vaccine: Marland Kitchen Difficulty breathing  . Swelling of face and throat  . A fast heartbeat  . A bad rash all over body  . Dizziness and weakness   Immunizations Administered    Name Date Dose VIS Date Route   Pfizer COVID-19 Vaccine 01/11/2020  3:14 PM 0.3 mL 09/11/2019 Intramuscular   Manufacturer: [employer], [employer]   Lot: [id number]   NDC: [address]-1

## 2020-04-29 ENCOUNTER — Ambulatory Visit (INDEPENDENT_AMBULATORY_CARE_PROVIDER_SITE_OTHER): Payer: PRIVATE HEALTH INSURANCE | Admitting: Urology

## 2020-04-29 ENCOUNTER — Encounter: Payer: Self-pay | Admitting: Urology

## 2020-04-29 ENCOUNTER — Other Ambulatory Visit: Payer: Self-pay

## 2020-04-29 VITALS — BP 143/91 | HR 101 | Ht 72.0 in | Wt 330.0 lb

## 2020-04-29 DIAGNOSIS — N471 Phimosis: Secondary | ICD-10-CM

## 2020-04-30 ENCOUNTER — Encounter: Payer: Self-pay | Admitting: Urology

## 2020-04-30 DIAGNOSIS — N471 Phimosis: Secondary | ICD-10-CM | POA: Insufficient documentation

## 2020-04-30 NOTE — H&P (View-Only) (Signed)
   04/29/2020 9:51 AM   Shin J Mulnix [birth year] [id number]  Referring provider: Gracelyn Nurse, MD [address] [place],  [place] [address]  Chief Complaint  Patient presents with  . Other    HPI: 39 y.o. male presents for follow-up of phimosis   Initially seen by our PA 05/2019 and scheduled for circumcision  Morbidly obese and circumcision was not recommended  He desired 2nd opinion and was referred to Dr. Guy Sandifer at [facility] however [facility] did not accept his insurance  Subsequently referred to [facility] by his PCP however after review urology indicated they would not perform circumcision with his BMI and he needed weight loss  He was referred to bariatrics who recommended weight loss surgery however patient declined  Complains of intermittent burning, itching of glans and ballooning of foreskin when voiding   PMH: Past Medical History:  Diagnosis Date  . Hypertension   . Obesity   . Sleep apnea     Surgical History: Past Surgical History:  Procedure Laterality Date  . MOUTH SURGERY      Home Medications:  Allergies as of 04/29/2020   No Known Allergies     Medication List       Accurate as of April 29, 2020 11:59 PM. If you have any questions, ask your nurse or doctor.        hydrochlorothiazide 25 MG tablet Commonly known as: HYDRODIURIL Take 12.5 mg by mouth daily.   losartan 50 MG tablet Commonly known as: COZAAR Take 50 mg by mouth daily.   Turmeric 500 MG Caps Take 500 mg by mouth daily.       Allergies: No Known Allergies  Family History: Family History  Problem Relation Age of Onset  . Lupus Mother   . Cancer Father 33       bladder    Social History:  reports that he quit smoking about 14 years ago. He has never used smokeless tobacco. He reports previous alcohol use. He reports that he does not use drugs.   Physical Exam: BP (!) 143/91   Pulse 101   Ht 6' (1.829 m)   Wt (!) 330 lb (149.7 kg)   BMI 44.76 kg/m    Constitutional:  Alert and oriented, No acute distress. HEENT: [place] AT, moist mucus membranes.  Trachea midline, no masses. Cardiovascular: No clubbing, cyanosis, or edema.  RRR Respiratory: Normal respiratory effort, no increased work of breathing.  Clear GI: Abdomen is obese soft, nontender GU: Phallus retracted but not buried.  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 Neurologic: Grossly intact, no focal deficits, moving all 4 extremities. Psychiatric: Normal mood and affect.   Assessment & Plan:    1. Phimosis  Tight phimosis with symptoms  Based on morbid obesity would not recommend circumcision  We did discuss dorsal slit which should allow him to retract the penis to void  He would like to schedule.  The procedure was discussed including potential risks of bleeding, infection and scarring  He indicated all questions were answered and desires to proceed   Riki Altes, MD  [facility] [address], [address] [place], [place] [address] [phone]

## 2020-04-30 NOTE — Progress Notes (Signed)
   04/29/2020 9:51 AM   Collie J Obriant 05/26/1981 [id number]  Referring provider: Johnston, John D, MD [address] [place],  [place] [address]  Chief Complaint  Patient presents with  . Other    HPI: 38 y.o. male presents for follow-up of phimosis   Initially seen by our PA 05/2019 and scheduled for circumcision  Morbidly obese and circumcision was not recommended  He desired 2nd opinion and was referred to Dr. Figler at [facility] however [facility] did not accept his insurance  Subsequently referred to [facility] by his PCP however after review urology indicated they would not perform circumcision with his BMI and he needed weight loss  He was referred to bariatrics who recommended weight loss surgery however patient declined  Complains of intermittent burning, itching of glans and ballooning of foreskin when voiding   PMH: Past Medical History:  Diagnosis Date  . Hypertension   . Obesity   . Sleep apnea     Surgical History: Past Surgical History:  Procedure Laterality Date  . MOUTH SURGERY      Home Medications:  Allergies as of 04/29/2020   No Known Allergies     Medication List       Accurate as of April 29, 2020 11:59 PM. If you have any questions, ask your nurse or doctor.        hydrochlorothiazide 25 MG tablet Commonly known as: HYDRODIURIL Take 12.5 mg by mouth daily.   losartan 50 MG tablet Commonly known as: COZAAR Take 50 mg by mouth daily.   Turmeric 500 MG Caps Take 500 mg by mouth daily.       Allergies: No Known Allergies  Family History: Family History  Problem Relation Age of Onset  . Lupus Mother   . Cancer Father 58       bladder    Social History:  reports that he quit smoking about 14 years ago. He has never used smokeless tobacco. He reports previous alcohol use. He reports that he does not use drugs.   Physical Exam: BP (!) 143/91   Pulse 101   Ht 6' (1.829 m)   Wt (!) 330 lb (149.7 kg)   BMI 44.76 kg/m    Constitutional:  Alert and oriented, No acute distress. HEENT: [place] AT, moist mucus membranes.  Trachea midline, no masses. Cardiovascular: No clubbing, cyanosis, or edema.  RRR Respiratory: Normal respiratory effort, no increased work of breathing.  Clear GI: Abdomen is obese soft, nontender GU: Phallus retracted but not buried.  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 Neurologic: Grossly intact, no focal deficits, moving all 4 extremities. Psychiatric: Normal mood and affect.   Assessment & Plan:    1. Phimosis  Tight phimosis with symptoms  Based on morbid obesity would not recommend circumcision  We did discuss dorsal slit which should allow him to retract the penis to void  He would like to schedule.  The procedure was discussed including potential risks of bleeding, infection and scarring  He indicated all questions were answered and desires to proceed   Reshma Hoey C May Manrique, MD  [facility] [address], [address] ,  [address] [phone]  

## 2020-05-05 ENCOUNTER — Telehealth: Payer: Self-pay | Admitting: Radiology

## 2020-05-05 NOTE — Telephone Encounter (Signed)
Patient would like to schedule surgery with Dr Lonna Cobb.

## 2020-05-09 ENCOUNTER — Other Ambulatory Visit: Payer: Self-pay | Admitting: Radiology

## 2020-05-09 DIAGNOSIS — N471 Phimosis: Secondary | ICD-10-CM

## 2020-05-12 ENCOUNTER — Other Ambulatory Visit: Payer: Self-pay

## 2020-05-12 ENCOUNTER — Other Ambulatory Visit
Admission: RE | Admit: 2020-05-12 | Discharge: 2020-05-12 | Disposition: A | Discharge status: Home / Self Care | Payer: PRIVATE HEALTH INSURANCE | Source: Ambulatory Visit | Attending: Urology | Admitting: Urology

## 2020-05-12 DIAGNOSIS — Z20822 Contact with and (suspected) exposure to covid-19: Secondary | ICD-10-CM | POA: Insufficient documentation

## 2020-05-12 DIAGNOSIS — I10 Essential (primary) hypertension: Secondary | ICD-10-CM | POA: Insufficient documentation

## 2020-05-12 DIAGNOSIS — E669 Obesity, unspecified: Secondary | ICD-10-CM | POA: Insufficient documentation

## 2020-05-12 DIAGNOSIS — Z01818 Encounter for other preprocedural examination: Secondary | ICD-10-CM | POA: Insufficient documentation

## 2020-05-12 NOTE — Patient Instructions (Signed)
INSTRUCTIONS FOR SURGERY     Your surgery is scheduled for:   Tuesday, AUGUST 17TH     To find out your arrival time for the day of surgery,          please call [phone] between 1 pm and 3 pm on :  Monday, AUGUST 16TH     When you arrive for surgery, report to the [facility].       Do NOT stop on the first floor to register.    REMEMBER: Instructions that are not followed completely may result in serious medical risk,  up to and including death, or upon the discretion of your surgeon and anesthesiologist,            your surgery may need to be rescheduled.  __X__ 1. Do not eat food after midnight the night before your procedure.                    No gum, candy, lozenger, tic tacs, tums or hard candies.                  ABSOLUTELY NOTHING SOLID IN YOUR MOUTH AFTER MIDNIGHT                    You may drink unlimited clear liquids up to 2 hours before you are scheduled to arrive for surgery.                   Do not drink anything within those 2 hours unless you need to take medicine, then take the                   smallest amount you need.  Clear liquids include:  water, apple juice without pulp,                   any flavor Gatorade, Black coffee, black tea.  Sugar may be added but no dairy/ honey /lemon.                        Broth and jello is not considered a clear liquid.  __x__  2. On the morning of surgery, please brush your teeth with toothpaste and water. You may rinse with                  mouthwash if you wish but DO NOT SWALLOW TOOTHPASTE OR MOUTHWASH  __X___3. NO alcohol for 24 hours before or after surgery.  __x___ 4.  Do NOT smoke or use e-cigarettes for 24 HOURS PRIOR TO SURGERY.                      DO NOT Use any chewable tobacco products for at least 6 hours prior to surgery.  __x___ 5. If you start any new medication after this appointment and prior to surgery, please                    Bring it with you on the day of surgery.  ___x__ 6. Notify your doctor if there is any change in  your medical condition, such as fever,                  infection, vomitting, diarrhea or any open sores.  __x___ 7.  USE the CHG SOAP as instructed, the night before surgery and the day of surgery.                   Do not scrub your penis with this soap as it may be irritating.                   Once you have washed with this soap, do NOT use any of the following: Powders, perfumes                    or lotions. Please do not wear make up, hairpins, clips or nail polish. You may  wear deodorant.                   Men may shave their face and neck.  Women need to shave 48 hours prior to surgery.                   DO NOT wear ANY jewelry on the day of surgery. If there are rings that are too tight to                    remove easily, please address this prior to the surgery day. Piercings need to be removed.                                                                     NO METAL ON YOUR BODY.                    Do NOT bring any valuables.  If you came to Pre-Admit testing then you will not need license,                     insurance card or credit card.  If you will be staying overnight, please either leave your things in                     the car or have your family be responsible for these items.                     [facility] IS NOT RESPONSIBLE FOR BELONGINGS OR VALUABLES.  ___X__ 8. DO NOT wear contact lenses on surgery day.  You may not have dentures,                     Hearing aides, contacts or glasses in the operating room. These items can be                    Placed in the Recovery Room to receive immediately after surgery.  __x___ 9. IF YOU ARE SCHEDULED TO GO HOME ON THE SAME DAY, YOU MUST                   Have someone to drive you home and to stay with you  for the first 24 hours.  Have an arrangement prior to arriving on surgery day.  ___x__ 10. Take  the following medications on the morning of surgery with a sip of water:                              1. none                     2  _____ 11.  Follow any instructions provided to you by your surgeon.                        Such as enema, clear liquid bowel prep  __X__  12. STOP ALL ASPIRIN PRODUCTS ONE WEEK PRIOR TO SURGERY (05/12/20)                       THIS INCLUDES BC POWDERS / GOODIES POWDER  __x___ 13. STOP Anti-inflammatories as of: ONE WEEK PRIOR TO SURGERY (05/12/20)                      This includes IBUPROFEN / MOTRIN / ADVIL / ALEVE/ NAPROXYN                    YOU MAY TAKE TYLENOL ANY TIME PRIOR TO SURGERY.  __X___ 15. Bring your CPAP machine into preop with you on the morning of surgery.   _X_____17.  Continue to take the following medications but do not take on the morning of surgery:                        HYDROCHLOROTHIAZIDE // LOSARTAN  ___X___18. Wear clean and comfortable clothing to the hospital.  PLEASE REMEMBER TO BRING YOUR CPAP MACHINE.

## 2020-05-13 ENCOUNTER — Other Ambulatory Visit
Admission: RE | Admit: 2020-05-13 | Discharge: 2020-05-13 | Disposition: A | Discharge status: Home / Self Care | Payer: PRIVATE HEALTH INSURANCE | Source: Ambulatory Visit | Attending: Urology | Admitting: Urology

## 2020-05-13 DIAGNOSIS — Z01818 Encounter for other preprocedural examination: Secondary | ICD-10-CM | POA: Diagnosis not present

## 2020-05-13 LAB — CBC
HCT: 42.3 % (ref 39.0–52.0)
Hemoglobin: 14.6 g/dL (ref 13.0–17.0)
MCH: 29.3 pg (ref 26.0–34.0)
MCHC: 34.5 g/dL (ref 30.0–36.0)
MCV: 84.8 fL (ref 80.0–100.0)
Platelets: 191 10*3/uL (ref 150–400)
RBC: 4.99 MIL/uL (ref 4.22–5.81)
RDW: 13.7 % (ref 11.5–15.5)
WBC: 6.3 10*3/uL (ref 4.0–10.5)
nRBC: 0 % (ref 0.0–0.2)

## 2020-05-13 LAB — BASIC METABOLIC PANEL
Anion gap: 11 (ref 5–15)
BUN: 13 mg/dL (ref 6–20)
CO2: 25 mmol/L (ref 22–32)
Calcium: 9.1 mg/dL (ref 8.9–10.3)
Chloride: 102 mmol/L (ref 98–111)
Creatinine, Ser: 1.04 mg/dL (ref 0.61–1.24)
GFR calc Af Amer: >60 mL/min (ref >60)
GFR calc non Af Amer: >60 mL/min (ref >60)
Glucose, Bld: 110 mg/dL — ABNORMAL HIGH (ref 70–99)
Potassium: 3.7 mmol/L (ref 3.5–5.1)
Sodium: 138 mmol/L (ref 135–145)

## 2020-05-14 LAB — SARS CORONAVIRUS 2 (TAT 6-24 HRS): SARS Coronavirus 2: NEGATIVE

## 2020-05-17 ENCOUNTER — Ambulatory Visit
Admission: RE | Admit: 2020-05-17 | Discharge: 2020-05-17 | Disposition: A | Discharge status: Home / Self Care | Payer: PRIVATE HEALTH INSURANCE | Attending: Urology | Admitting: Urology

## 2020-05-17 ENCOUNTER — Ambulatory Visit: Payer: PRIVATE HEALTH INSURANCE | Admitting: Anesthesiology

## 2020-05-17 ENCOUNTER — Other Ambulatory Visit: Payer: Self-pay

## 2020-05-17 ENCOUNTER — Encounter: Payer: Self-pay | Admitting: Urology

## 2020-05-17 ENCOUNTER — Encounter
Admission: RE | Disposition: A | Discharge status: Home / Self Care | Payer: Self-pay | Source: Home / Self Care | Attending: Urology

## 2020-05-17 DIAGNOSIS — I10 Essential (primary) hypertension: Secondary | ICD-10-CM | POA: Diagnosis not present

## 2020-05-17 DIAGNOSIS — Z87891 Personal history of nicotine dependence: Secondary | ICD-10-CM | POA: Insufficient documentation

## 2020-05-17 DIAGNOSIS — Z6841 Body Mass Index (BMI) 40.0 and over, adult: Secondary | ICD-10-CM | POA: Diagnosis not present

## 2020-05-17 DIAGNOSIS — N471 Phimosis: Secondary | ICD-10-CM | POA: Insufficient documentation

## 2020-05-17 DIAGNOSIS — G4730 Sleep apnea, unspecified: Secondary | ICD-10-CM | POA: Diagnosis not present

## 2020-05-17 DIAGNOSIS — Z79899 Other long term (current) drug therapy: Secondary | ICD-10-CM | POA: Diagnosis not present

## 2020-05-17 HISTORY — PX: DORSAL SLIT: SHX6822

## 2020-05-17 SURGERY — DORSAL SLIT, PREPUCE
Anesthesia: General

## 2020-05-17 MED ORDER — FENTANYL CITRATE (PF) 100 MCG/2ML IJ SOLN: 25.0000 ug | INTRAMUSCULAR | Status: DC | PRN

## 2020-05-17 MED ORDER — ONDANSETRON HCL 4 MG/2ML IJ SOLN
INTRAMUSCULAR | Status: AC
  Filled 2020-05-17: qty 2

## 2020-05-17 MED ORDER — LIDOCAINE HCL (CARDIAC) PF 100 MG/5ML IV SOSY
PREFILLED_SYRINGE | INTRAVENOUS | Status: DC | PRN
  Administered 2020-05-17: 100 mg via INTRAVENOUS

## 2020-05-17 MED ORDER — CHLORHEXIDINE GLUCONATE 0.12 % MT SOLN
OROMUCOSAL | Status: AC
  Administered 2020-05-17: 15 mL via OROMUCOSAL
  Filled 2020-05-17: qty 15

## 2020-05-17 MED ORDER — CEFAZOLIN SODIUM-DEXTROSE 2-4 GM/100ML-% IV SOLN
INTRAVENOUS | Status: AC
  Filled 2020-05-17: qty 100

## 2020-05-17 MED ORDER — OXYCODONE HCL 5 MG PO TABS: 5.0000 mg | ORAL_TABLET | Freq: Once | ORAL | Status: AC | PRN

## 2020-05-17 MED ORDER — CEFAZOLIN SODIUM-DEXTROSE 2-4 GM/100ML-% IV SOLN
2.0000 g | INTRAVENOUS | Status: AC
  Administered 2020-05-17: 2 g via INTRAVENOUS

## 2020-05-17 MED ORDER — SULFAMETHOXAZOLE-TRIMETHOPRIM 800-160 MG PO TABS: 1.0000 | ORAL_TABLET | Freq: Two times a day (BID) | ORAL | 0 refills | Status: AC

## 2020-05-17 MED ORDER — OXYCODONE HCL 5 MG/5ML PO SOLN: 5.0000 mg | Freq: Once | ORAL | Status: AC | PRN

## 2020-05-17 MED ORDER — OXYCODONE-ACETAMINOPHEN 5-325 MG PO TABS: 1.0000 | ORAL_TABLET | Freq: Four times a day (QID) | ORAL | 0 refills | Status: DC | PRN

## 2020-05-17 MED ORDER — LACTATED RINGERS IV SOLN: INTRAVENOUS | Status: DC

## 2020-05-17 MED ORDER — BACITRACIN ZINC 500 UNIT/GM EX OINT
TOPICAL_OINTMENT | CUTANEOUS | Status: AC
  Filled 2020-05-17: qty 28.35

## 2020-05-17 MED ORDER — ORAL CARE MOUTH RINSE: 15.0000 mL | Freq: Once | OROMUCOSAL | Status: AC

## 2020-05-17 MED ORDER — DEXAMETHASONE SODIUM PHOSPHATE 10 MG/ML IJ SOLN
INTRAMUSCULAR | Status: DC | PRN
  Administered 2020-05-17: 5 mg via INTRAVENOUS

## 2020-05-17 MED ORDER — LIDOCAINE HCL 1 % IJ SOLN
INTRAMUSCULAR | Status: DC | PRN
  Administered 2020-05-17: 4 mL

## 2020-05-17 MED ORDER — FENTANYL CITRATE (PF) 100 MCG/2ML IJ SOLN
INTRAMUSCULAR | Status: AC
  Filled 2020-05-17: qty 2

## 2020-05-17 MED ORDER — ONDANSETRON HCL 4 MG/2ML IJ SOLN
INTRAMUSCULAR | Status: DC | PRN
  Administered 2020-05-17: 4 mg via INTRAVENOUS

## 2020-05-17 MED ORDER — DEXAMETHASONE SODIUM PHOSPHATE 10 MG/ML IJ SOLN
INTRAMUSCULAR | Status: AC
  Filled 2020-05-17: qty 1

## 2020-05-17 MED ORDER — LIDOCAINE HCL (PF) 2 % IJ SOLN
INTRAMUSCULAR | Status: AC
  Filled 2020-05-17: qty 5

## 2020-05-17 MED ORDER — FENTANYL CITRATE (PF) 100 MCG/2ML IJ SOLN
INTRAMUSCULAR | Status: DC | PRN
  Administered 2020-05-17 (×2): 50 ug via INTRAVENOUS

## 2020-05-17 MED ORDER — PROPOFOL 10 MG/ML IV BOLUS
INTRAVENOUS | Status: AC
  Filled 2020-05-17: qty 20

## 2020-05-17 MED ORDER — PROPOFOL 10 MG/ML IV BOLUS
INTRAVENOUS | Status: DC | PRN
  Administered 2020-05-17: 200 mg via INTRAVENOUS

## 2020-05-17 MED ORDER — SUCCINYLCHOLINE CHLORIDE 20 MG/ML IJ SOLN
INTRAMUSCULAR | Status: DC | PRN
  Administered 2020-05-17: 200 mg via INTRAVENOUS

## 2020-05-17 MED ORDER — LIDOCAINE HCL (PF) 1 % IJ SOLN
INTRAMUSCULAR | Status: AC
  Filled 2020-05-17: qty 30

## 2020-05-17 MED ORDER — CHLORHEXIDINE GLUCONATE 0.12 % MT SOLN: 15.0000 mL | Freq: Once | OROMUCOSAL | Status: AC

## 2020-05-17 MED ORDER — MIDAZOLAM HCL 2 MG/2ML IJ SOLN
INTRAMUSCULAR | Status: DC | PRN
  Administered 2020-05-17: 2 mg via INTRAVENOUS

## 2020-05-17 MED ORDER — BUPIVACAINE HCL (PF) 0.5 % IJ SOLN
INTRAMUSCULAR | Status: AC
  Filled 2020-05-17: qty 30

## 2020-05-17 MED ORDER — OXYCODONE HCL 5 MG PO TABS
ORAL_TABLET | ORAL | Status: AC
  Administered 2020-05-17: 5 mg via ORAL
  Filled 2020-05-17: qty 1

## 2020-05-17 MED ORDER — MIDAZOLAM HCL 2 MG/2ML IJ SOLN
INTRAMUSCULAR | Status: AC
  Filled 2020-05-17: qty 2

## 2020-05-17 SURGICAL SUPPLY — 34 items
APL PRP STRL LF DISP 70% ISPRP (MISCELLANEOUS) ×1
BLADE CLIPPER SURG (BLADE) ×1 IMPLANT
BLADE SURG 15 STRL LF DISP TIS (BLADE) ×1 IMPLANT
BLADE SURG 15 STRL SS (BLADE) ×3
BNDG COHESIVE 1X5 TAN NS LF (GAUZE/BANDAGES/DRESSINGS) IMPLANT
BNDG CONFORM 2 STRL LF (GAUZE/BANDAGES/DRESSINGS) ×1 IMPLANT
CANISTER SUCT 1200ML W/VALVE (MISCELLANEOUS) ×1 IMPLANT
CHLORAPREP W/TINT 26 (MISCELLANEOUS) ×3 IMPLANT
COVER WAND RF STERILE (DRAPES) ×1 IMPLANT
DECANTER SPIKE VIAL GLASS SM (MISCELLANEOUS) ×2 IMPLANT
DRAPE LAPAROTOMY 77X122 PED (DRAPES) ×3 IMPLANT
ELECT REM PT RETURN 9FT ADLT (ELECTROSURGICAL) ×3
ELECTRODE REM PT RTRN 9FT ADLT (ELECTROSURGICAL) ×1 IMPLANT
GAUZE PETROLATUM 1 X8 (GAUZE/BANDAGES/DRESSINGS) ×1 IMPLANT
GLOVE BIOGEL PI IND STRL 7.5 (GLOVE) ×1 IMPLANT
GLOVE BIOGEL PI INDICATOR 7.5 (GLOVE) ×2
GOWN STRL REUS W/ TWL LRG LVL3 (GOWN DISPOSABLE) ×1 IMPLANT
GOWN STRL REUS W/ TWL XL LVL3 (GOWN DISPOSABLE) ×1 IMPLANT
GOWN STRL REUS W/TWL LRG LVL3 (GOWN DISPOSABLE) ×3
GOWN STRL REUS W/TWL XL LVL3 (GOWN DISPOSABLE) ×3
KIT TURNOVER KIT A (KITS) ×3 IMPLANT
LABEL OR SOLS (LABEL) ×1 IMPLANT
NDL HYPO 25X1 1.5 SAFETY (NEEDLE) ×1 IMPLANT
NEEDLE HYPO 25X1 1.5 SAFETY (NEEDLE) ×3 IMPLANT
NS IRRIG 500ML POUR BTL (IV SOLUTION) ×3 IMPLANT
PACK BASIN MINOR (MISCELLANEOUS) ×3 IMPLANT
SOL PREP PVP 2OZ (MISCELLANEOUS) ×3
SOLUTION PREP PVP 2OZ (MISCELLANEOUS) ×1 IMPLANT
STRETCH NET 2 107126 (MISCELLANEOUS) ×1 IMPLANT
SUT CHROMIC 3 0 SH 27 (SUTURE) ×5 IMPLANT
SUT CHROMIC 4 0 RB 1X27 (SUTURE) ×1 IMPLANT
SUT CHROMIC 4 0 SH 27 (SUTURE) IMPLANT
SUT VICRYL 3-0 RB1 18 ABS (SUTURE) ×2 IMPLANT
SYR 10ML LL (SYRINGE) ×3 IMPLANT

## 2020-05-17 NOTE — Anesthesia Procedure Notes (Signed)
Procedure Name: Intubation Performed by: Fredderick Phenix, CRNA Pre-anesthesia Checklist: Patient identified, Emergency Drugs available, Suction available and Patient being monitored Patient Re-evaluated:Patient Re-evaluated prior to induction Oxygen Delivery Method: Circle system utilized Preoxygenation: Pre-oxygenation with 100% oxygen Induction Type: IV induction Ventilation: Mask ventilation without difficulty Laryngoscope Size: Mac and 4 Grade View: Grade II Tube type: Oral Tube size: 7.5 mm Number of attempts: 1 Airway Equipment and Method: Stylet and Oral airway Placement Confirmation: ETT inserted through vocal cords under direct vision,  positive ETCO2 and breath sounds checked- equal and bilateral Secured at: 24 cm Tube secured with: Tape Dental Injury: Teeth and Oropharynx as per pre-operative assessment

## 2020-05-17 NOTE — Transfer of Care (Signed)
Immediate Anesthesia Transfer of Care Note  Patient: Michael Cuevas  Procedure(s) Performed: DORSAL SLIT (N/A )  Patient Location: PACU  Anesthesia Type:General  Level of Consciousness: awake and alert   Airway & Oxygen Therapy: Patient Spontanous Breathing  Post-op Assessment: Report given to RN and Post -op Vital signs reviewed and stable  Post vital signs: Reviewed and stable  Last Vitals:  Vitals Value Taken Time  BP 133/81 05/17/20 1158  Temp    Pulse 91 05/17/20 1201  Resp 18 05/17/20 1201  SpO2 96 % 05/17/20 1201  Vitals shown include unvalidated device data.  Last Pain:  Vitals:   05/17/20 0837  TempSrc: Temporal  PainSc: 0-No pain         Complications: No complications documented.

## 2020-05-17 NOTE — Interval H&P Note (Signed)
History and Physical Interval Note:  05/17/2020 10:28 AM  Michael Cuevas  has presented today for surgery, with the diagnosis of phimosis.  The various methods of treatment have been discussed with the patient and family. After consideration of risks, benefits and other options for treatment, the patient has consented to  Procedure(s) with comments: DORSAL SLIT (N/A) - MAC and local as a surgical intervention.  The patient's history has been reviewed, patient examined, no change in status, stable for surgery.  I have reviewed the patient's chart and labs.  Questions were answered to the patient's satisfaction.     [place]

## 2020-05-17 NOTE — Discharge Instructions (Addendum)
Postoperative instructions for dorsal slit  Wound:  In most cases your incision will have absorbable sutures that run along the course of your incision and will dissolve within the first 10-20 days. Some will fall out even earlier. Expect some redness as the sutures dissolved but this should occur only around the sutures. If there is generalized redness, especially with increasing pain or swelling, let us know.   Diet:  You may return to your normal diet within 24 hours following your surgery. You may note some mild nausea and possibly vomiting the first 6-8 hours following surgery. This is usually due to the side effects of anesthesia, and will disappear quite soon. I would suggest clear liquids and a very light meal the first evening following your surgery.  Activity:  Your physical activity should be restricted the first 48 hours. During that time you should remain relatively inactive, moving about only when necessary. During the first 7-10 days following surgery he should avoid lifting any heavy objects (anything greater than 15 pounds), and avoid strenuous exercise. If you work, ask [place] specifically about your restrictions, both for work and home. We will write a note to your employer if needed.  Ice packs can be placed on and off over the penis for the first 48 hours to help relieve the pain and keep the swelling down. Frozen peas or corn in a ZipLock bag can be frozen, used and re-frozen. Fifteen minutes on and 15 minutes off is a reasonable schedule.   Dressing: N/A   Hygiene:  You may shower 48 hours after your surgery. Tub bathing should be restricted until the seventh day.  Medication:  You will be sent home with some type of pain medication. In many cases you will be sent home with a narcotic pain pill (Vicodin or Tylox). If the pain is not too bad, you may take either Tylenol (acetaminophen) or Advil (ibuprofen) which contain no narcotic agents, and might be tolerated a little  better, with fewer side effects. If the pain medication you are sent home with does not control the pain, you will have to let us know. Some narcotic pain medications cannot be given or refilled by a phone call to a pharmacy.  Problems you should report to [place]:   Fever of 101.0 degrees Fahrenheit or greater.  Moderate or severe swelling under the skin incision or involving the scrotum.  Drug reaction such as hives, a rash, nausea or vomiting.     AMBULATORY SURGERY  DISCHARGE INSTRUCTIONS   1) The drugs that you were given will stay in your system until tomorrow so for the next 24 hours you should not:  A) Drive an automobile B) Make any legal decisions C) Drink any alcoholic beverage   2) You may resume regular meals tomorrow.  Today it is better to start with liquids and gradually work up to solid foods.  You may eat anything you prefer, but it is better to start with liquids, then soup and crackers, and gradually work up to solid foods.   3) Please notify your doctor immediately if you have any unusual bleeding, trouble breathing, redness and pain at the surgery site, drainage, fever, or pain not relieved by medication.    4) Additional Instructions:        Please contact your physician with any problems or Same Day Surgery at [phone], Monday through Friday 6 am to 4 pm, or [place] at [facility] number at [phone].

## 2020-05-17 NOTE — Anesthesia Preprocedure Evaluation (Signed)
Anesthesia Evaluation  Patient identified by MRN, date of birth, ID band Patient awake    Reviewed: Allergy & Precautions, H&P , NPO status , Patient's Chart, lab work & pertinent test results  History of Anesthesia Complications Negative for: history of anesthetic complications  Airway Mallampati: III  TM Distance: >3 FB Neck ROM: full    Dental  (+) Chipped   Pulmonary neg shortness of breath, sleep apnea and Continuous Positive Airway Pressure Ventilation , former smoker,    Pulmonary exam normal        Cardiovascular Exercise Tolerance: Good hypertension, (-) angina(-) Past MI and (-) DOE Normal cardiovascular exam     Neuro/Psych negative neurological ROS  negative psych ROS   GI/Hepatic negative GI ROS, Neg liver ROS, neg GERD  ,  Endo/Other  negative endocrine ROS  Renal/GU      Musculoskeletal   Abdominal   Peds  Hematology negative hematology ROS (+)   Anesthesia Other Findings Past Medical History: No date: Hypertension No date: Obesity No date: Sleep apnea     Comment:  uses cpap nightly  Past Surgical History: 2005: MOUTH SURGERY     Comment:  upper dentures now     Reproductive/Obstetrics negative OB ROS                             Anesthesia Physical Anesthesia Plan  ASA: III  Anesthesia Plan: General ETT   Post-op Pain Management:    Induction: Intravenous  PONV Risk Score and Plan: Ondansetron, Dexamethasone, Midazolam and Treatment may vary due to age or medical condition  Airway Management Planned: Oral ETT and Video Laryngoscope Planned  Additional Equipment:   Intra-op Plan:   Post-operative Plan: Extubation in OR  Informed Consent: I have reviewed the patients History and Physical, chart, labs and discussed the procedure including the risks, benefits and alternatives for the proposed anesthesia with the patient or authorized representative who  has indicated his/her understanding and acceptance.     Dental Advisory Given  Plan Discussed with: Anesthesiologist, CRNA and Surgeon  Anesthesia Plan Comments: (Patient consented for risks of anesthesia including but not limited to:  - adverse reactions to medications - damage to eyes, teeth, lips or other oral mucosa - nerve damage due to positioning  - sore throat or hoarseness - Damage to heart, brain, nerves, lungs, other parts of body or loss of life  Patient voiced understanding.)        Anesthesia Quick Evaluation

## 2020-05-17 NOTE — Anesthesia Postprocedure Evaluation (Signed)
Anesthesia Post Note  Patient: Michael Cuevas  Procedure(s) Performed: DORSAL SLIT (N/A )  Patient location during evaluation: PACU Anesthesia Type: General Level of consciousness: awake and alert Pain management: pain level controlled Vital Signs Assessment: post-procedure vital signs reviewed and stable Respiratory status: spontaneous breathing, nonlabored ventilation, respiratory function stable and patient connected to nasal cannula oxygen Cardiovascular status: blood pressure returned to baseline and stable Postop Assessment: no apparent nausea or vomiting Anesthetic complications: no   No complications documented.   Last Vitals:  Vitals:   05/17/20 1228 05/17/20 1243  BP: (!) 150/134 135/68  Pulse: 88 83  Resp: 16 13  Temp:  (!) 36.2 C  SpO2: 95% 95%    Last Pain:  Vitals:   05/17/20 1243  TempSrc:   PainSc: 3                  Cleda Mccreedy Fatmata Legere

## 2020-05-17 NOTE — Op Note (Signed)
Preoperative diagnosis:  Severe phimosis  Postoperative diagnosis:  Same  Procedure: Dorsal slit  Surgeon: Riki Altes, MD  Anesthesia: General/penile block  Complications: None  Intraoperative findings: Retracted but not buried penis; severe phimosis  EBL: Minimal  Specimens: None  Indication: Michael Cuevas is a 39 y.o. patient severe phimosis.  He was initially referred for circumcision however this was not recommended secondary to morbid obesity.  He opted for a second opinion at [facility] and a declined to see the patient based on his BMI and recommended bariatric evaluation.  He is unable to retract his foreskin and I did discuss the option of a dorsal slit which he has elected to pursue.  After reviewing the management options for treatment, he elected to proceed with the above surgical procedure(s). We have discussed the potential benefits and risks of the procedure, side effects of the proposed treatment, the likelihood of the patient achieving the goals of the procedure, and any potential problems that might occur during the procedure or recuperation. Informed consent has been obtained.  Description of procedure:  The patient was taken to the operating room and general anesthesia was induced.  The patient was placed in the dorsal lithotomy position, prepped and draped in the usual sterile fashion, and preoperative antibiotics were administered. A preoperative time-out was performed.   Using downward pressure on adipose tissue the phallus was examined and was remarkable for a tight phimosis with the meatus unable to be visualized.  The distal prepuce was clamped with a Kocher and incised.  This was carried back to the corona radiata.  Hemostasis was obtained with cautery.  The skin edges were reapproximated with interrupted 3-0 chromic suture.  Bacitracin ointment was applied to the incision line.  Due to penile retraction it was not possible to place a dressing.  After  anesthetic reversal he was transported to the PACU in stable condition.  Plan: Office follow-up 1 month   Riki Altes, M.D.

## 2020-05-18 ENCOUNTER — Encounter: Payer: Self-pay | Admitting: Urology

## 2020-06-15 DIAGNOSIS — G4733 Obstructive sleep apnea (adult) (pediatric): Secondary | ICD-10-CM | POA: Insufficient documentation

## 2020-06-15 NOTE — Progress Notes (Signed)
06/16/2020 11:39 AM   Michael Cuevas [birth year] [id number]  Referring provider: Baxter Hire, MD [place],  [place] [address]  Chief Complaint  Patient presents with  . Follow-up    HPI: Michael Cuevas is a 39 y.o. male who is status post dorsal slit for phimosis.  He underwent a dorsal slit for phimosis on May 17, 2020 with Dr. John Giovanni.   Postoperative course was as expected and uneventful.  He has been pleased with the results.  He is able to urinate without issue.  He can withdraw the foreskin back enough for adequate cleaning.  Patient denies any modifying or aggravating factors.  Patient denies any gross hematuria, dysuria or suprapubic/flank pain.  Patient denies any fevers, chills, nausea or vomiting.   PMH: Past Medical History:  Diagnosis Date  . Hypertension   . Obesity   . Sleep apnea    uses cpap nightly    Surgical History: Past Surgical History:  Procedure Laterality Date  . DORSAL SLIT N/A 05/17/2020   Procedure: DORSAL SLIT;  Surgeon: Abbie Sons, MD;  Location: [facility] ORS;  Service: Urology;  Laterality: N/A;  MAC and local  . MOUTH SURGERY  2005   upper dentures now    Home Medications:  Allergies as of 06/16/2020   No Known Allergies     Medication List       Accurate as of June 16, 2020 11:59 PM. If you have any questions, ask your nurse or doctor.        STOP taking these medications   oxyCODONE-acetaminophen 5-325 MG tablet Commonly known as: PERCOCET/ROXICET Stopped by: Zara Council, PA-C     TAKE these medications   hydrochlorothiazide 25 MG tablet Commonly known as: HYDRODIURIL Take 12.5 mg by mouth daily.   losartan 50 MG tablet Commonly known as: COZAAR Take 50 mg by mouth daily.       Allergies: No Known Allergies  Family History: Family History  Problem Relation Age of Onset  . Lupus Mother   . Cancer Father 100       bladder    Social History:  reports that he  quit smoking about 14 years ago. He has never used smokeless tobacco. He reports previous alcohol use. He reports that he does not use drugs.  ROS: Pertinent ROS in HPI  Physical Exam: BP 122/70   Pulse 73   Wt (!) 330 lb (149.7 kg)   BMI 50.18 kg/m   Constitutional:  Well nourished. Alert and oriented, No acute distress. HEENT: [place] AT, mask in place.  Trachea midline Cardiovascular: No clubbing, cyanosis, or edema. Respiratory: Normal respiratory effort, no increased work of breathing. GU: No CVA tenderness.  No bladder fullness or masses.  Patient with uncircumcised phallus.  Foreskin easily retracted  Urethral meatus is patent.  No penile discharge. No penile lesions or rashes.  Dorsal slit incision is healed nicely.   Neurologic: Grossly intact, no focal deficits, moving all 4 extremities. Psychiatric: Normal mood and affect.  Laboratory Data: Lab Results  Component Value Date   WBC 6.3 05/13/2020   HGB 14.6 05/13/2020   HCT 42.3 05/13/2020   MCV 84.8 05/13/2020   PLT 191 05/13/2020    Lab Results  Component Value Date   CREATININE 1.04 05/13/2020     Urinalysis    Component Value Date/Time   COLORURINE YELLOW 09/04/2017 1154   APPEARANCEUR Clear 12/24/2017 1350   LABSPEC 1.025 09/04/2017 1154  PHURINE 6.0 09/04/2017 1154   GLUCOSEU Negative 12/24/2017 1350   HGBUR NEGATIVE 09/04/2017 1154   BILIRUBINUR Negative 12/24/2017 1350   KETONESUR NEGATIVE 09/04/2017 1154   PROTEINUR Negative 12/24/2017 1350   PROTEINUR NEGATIVE 09/04/2017 1154   NITRITE Negative 12/24/2017 1350   NITRITE NEGATIVE 09/04/2017 1154   LEUKOCYTESUR Negative 12/24/2017 1350    I have reviewed the labs.   Pertinent Imaging: No recent imaging  Assessment & Plan:    1. Phimosis Patient status post dorsal slit - well healed Discussed with patient that if he would like to pursue a complete circumcision in the future, we could refer him to Dr. Francesca Jewett for consideration  Return if  symptoms worsen or fail to improve.  These notes generated with voice recognition software. I apologize for typographical errors.  Zara Council, PA-C  [facility] [address]  [place], [place] [phone]

## 2020-06-16 ENCOUNTER — Ambulatory Visit (INDEPENDENT_AMBULATORY_CARE_PROVIDER_SITE_OTHER): Payer: PRIVATE HEALTH INSURANCE | Admitting: Urology

## 2020-06-16 ENCOUNTER — Other Ambulatory Visit: Payer: Self-pay

## 2020-06-16 VITALS — BP 122/70 | HR 73 | Wt 330.0 lb

## 2020-06-16 DIAGNOSIS — N471 Phimosis: Secondary | ICD-10-CM

## 2020-06-23 ENCOUNTER — Encounter: Payer: Self-pay | Admitting: Urology

## 2024-03-27 ENCOUNTER — Other Ambulatory Visit: Payer: Self-pay | Admitting: Internal Medicine

## 2024-03-27 DIAGNOSIS — R1031 Right lower quadrant pain: Secondary | ICD-10-CM

## 2024-04-02 ENCOUNTER — Ambulatory Visit
Admission: RE | Admit: 2024-04-02 | Discharge: 2024-04-02 | Disposition: A | Discharge status: Home / Self Care | Source: Ambulatory Visit | Attending: Internal Medicine | Admitting: Internal Medicine

## 2024-04-02 DIAGNOSIS — R1031 Right lower quadrant pain: Secondary | ICD-10-CM | POA: Insufficient documentation

## 2024-04-02 MED ORDER — IOHEXOL 350 MG/ML SOLN
100.0000 mL | Freq: Once | INTRAVENOUS | Status: AC | PRN
  Administered 2024-04-02: 100 mL via INTRAVENOUS
